# Patient Record
Sex: Male | Born: 1980 | Race: White | Hispanic: No | Marital: Single | Smoking: Current every day smoker
Health system: Southern US, Community
[De-identification: ages and names within clinical notes are randomized; demographics above are authoritative.]

## PROBLEM LIST (undated history)

## (undated) DIAGNOSIS — F329 Major depressive disorder, single episode, unspecified: Secondary | ICD-10-CM

## (undated) DIAGNOSIS — K219 Gastro-esophageal reflux disease without esophagitis: Secondary | ICD-10-CM

## (undated) DIAGNOSIS — F32A Depression, unspecified: Secondary | ICD-10-CM

## (undated) HISTORY — PX: APPENDECTOMY: SHX54

---

## 2017-10-01 ENCOUNTER — Emergency Department (HOSPITAL_COMMUNITY)
Admission: EM | Admit: 2017-10-01 | Discharge: 2017-10-01 | Disposition: A | Discharge status: Home / Self Care | Payer: Self-pay | Attending: Emergency Medicine | Admitting: Emergency Medicine

## 2017-10-01 ENCOUNTER — Other Ambulatory Visit: Payer: Self-pay

## 2017-10-01 ENCOUNTER — Encounter (HOSPITAL_COMMUNITY): Payer: Self-pay

## 2017-10-01 ENCOUNTER — Emergency Department (HOSPITAL_COMMUNITY): Payer: Self-pay

## 2017-10-01 DIAGNOSIS — Y999 Unspecified external cause status: Secondary | ICD-10-CM | POA: Insufficient documentation

## 2017-10-01 DIAGNOSIS — S0990XA Unspecified injury of head, initial encounter: Secondary | ICD-10-CM | POA: Insufficient documentation

## 2017-10-01 DIAGNOSIS — W228XXA Striking against or struck by other objects, initial encounter: Secondary | ICD-10-CM | POA: Insufficient documentation

## 2017-10-01 DIAGNOSIS — Y929 Unspecified place or not applicable: Secondary | ICD-10-CM | POA: Insufficient documentation

## 2017-10-01 DIAGNOSIS — F17210 Nicotine dependence, cigarettes, uncomplicated: Secondary | ICD-10-CM | POA: Insufficient documentation

## 2017-10-01 DIAGNOSIS — Y9301 Activity, walking, marching and hiking: Secondary | ICD-10-CM | POA: Insufficient documentation

## 2017-10-01 HISTORY — DX: Gastro-esophageal reflux disease without esophagitis: K21.9

## 2017-10-01 HISTORY — DX: Major depressive disorder, single episode, unspecified: F32.9

## 2017-10-01 HISTORY — DX: Depression, unspecified: F32.A

## 2017-10-01 MED ORDER — ONDANSETRON 4 MG PO TBDP: 4.0000 mg | ORAL_TABLET | Freq: Three times a day (TID) | ORAL | 0 refills | Status: AC | PRN

## 2017-10-01 NOTE — Discharge Instructions (Signed)
You were seen in the emergency department after an injury to your head.  Your CT scan of the head was normal.  We suspect this is a concussion at this time. Please follow up with the [place] [facility] clinic in 3 days:  [facility] [address]  [place], [place]  Phone: [phone]   Take zofran every 8 hours as needed for vomiting. We have prescribed you new medication(s) today. Discuss the medications prescribed today with your pharmacist as they can have adverse effects and interactions with your other medicines including over the counter and prescribed medications. Seek medical evaluation if you start to experience new or abnormal symptoms after taking one of these medicines, seek care immediately if you start to experience difficulty breathing, feeling of your throat closing, facial swelling, or rash as these could be indications of a more serious allergic reaction  Return to the ER for new or worsening symptoms or any other concerns.

## 2017-10-01 NOTE — ED Provider Notes (Signed)
[facility] EMERGENCY DEPARTMENT Provider Note   CSN: [id number] Arrival date & time: 10/01/17  1653     History   Chief Complaint Chief Complaint  Patient presents with  . Head Injury    HPI Cristian Hernandez is a 37 y.o. male with a hx of tobacco abuse, depression, and GERD who presents to the ED s/p head injury yesterday at 1330.  Patient states he was outside doing yard work wearing a baseball cap-he was walking fairly briskly when he ran into the limb of a tree branch.  He states that he hit the top of his head.  No loss of consciousness.  He did not fall to the ground or pass out.  He states that he "saw stars" very briefly, this has resolved and has not recurred, not dizzy or lightheaded necessarily.  He has had a fairly mild headache to the top of his head, rates this a 3 out of 10 in severity at present, he has taken ibuprofen with some improvement.  He states that he has had nausea and vomiting, several episodes of non bloody non bilious emesis yesterday and 1 this morning.  He has been able to tolerate PO fluids today.  He developed some minimal abdominal discomfort after he started vomiting.  No other areas of injury.  Denies neck pain, back pain, change in vision at present, numbness, weakness, tingling, difficulty with gait, seizure activity, or confusion.  Patient is not on any blood thinners.  HPI  Past Medical History:  Diagnosis Date  . Depression   . GERD (gastroesophageal reflux disease)     There are no active problems to display for this patient.   Past Surgical History:  Procedure Laterality Date  . APPENDECTOMY          Home Medications    Prior to Admission medications   Not on File    Family History History reviewed. No pertinent family history.  Social History Social History   Tobacco Use  . Smoking status: Current Every Day Smoker    Types: Cigarettes  . Smokeless tobacco: Never Used  Substance Use Topics  . Alcohol use:  Never    Frequency: Never  . Drug use: Never     Allergies   Patient has no allergy information on record.   Review of Systems Review of Systems  Constitutional: Negative for chills and fever.  HENT: Negative for ear discharge and ear pain.   Eyes: Negative for visual disturbance.  Gastrointestinal: Positive for abdominal pain, nausea and vomiting. Negative for blood in stool, constipation and diarrhea.  Neurological: Positive for headaches. Negative for dizziness, seizures, syncope, weakness and numbness.  All other systems reviewed and are negative.  Physical Exam Updated Vital Signs BP 124/85 (BP Location: Right Arm)   Pulse 71   Temp 98.6 F (37 C) (Oral)   Resp 16   Ht  (1.753 m)   Wt 95.3 kg (210 lb)   SpO2 95%   BMI 31.01 kg/m   Physical Exam  Constitutional: He appears well-developed and well-nourished.  Non-toxic appearance. No distress.  HENT:  Head: Head is with contusion (Right parietal region, mild, approximately 2cm, TTP). Head is without raccoon's eyes and without Battle's sign.  Right Ear: No hemotympanum.  Left Ear: No hemotympanum.  Nose: Nose normal.  Mouth/Throat: Uvula is midline and oropharynx is clear and moist.  Eyes: Conjunctivae are normal. Right eye exhibits no discharge. Left eye exhibits no discharge.  Neck: Normal range  of motion. Neck supple. No spinous process tenderness and no muscular tenderness present. Normal range of motion present.  Cardiovascular: Normal rate and regular rhythm.  No murmur heard. Pulmonary/Chest: Breath sounds normal. No respiratory distress. He has no wheezes. He has no rales.  Abdominal: Soft. He exhibits no distension. There is no tenderness.  Musculoskeletal:  No midline back tenderness.   Neurological:  Alert. Clear speech. No facial droop. CNIII-XII are grossly intact. Bilateral upper and lower extremities' sensation grossly intact touch. 5/5 grip strength bilaterally. 5/5 plantar and dorsi flexion  bilaterally. . Normal finger to nose bilaterally. Negative pronator drift. Negative Romberg sign. Gait is intact.   Skin: Skin is warm and dry. No rash noted.  Psychiatric: He has a normal mood and affect. His behavior is normal.  Nursing note and vitals reviewed.   ED Treatments / Results  Labs (all labs ordered are listed, but only abnormal results are displayed) Labs Reviewed - No data to display  EKG None  Radiology Ct Head Wo Contrast  Result Date: 10/01/2017 CLINICAL DATA:  Headache posttraumatic.  Head injury EXAM: CT HEAD WITHOUT CONTRAST TECHNIQUE: Contiguous axial images were obtained from the base of the skull through the vertex without intravenous contrast. COMPARISON:  None. FINDINGS: Brain: No evidence of acute infarction, hemorrhage, hydrocephalus, extra-axial collection or mass lesion/mass effect. Vascular: Negative for hyperdense vessel Skull: Negative Sinuses/Orbits: Negative Other: None IMPRESSION: Negative CT head Electronically Signed   By: Marlan Palau M.D.   On: 10/01/2017 20:13    Procedures Procedures (including critical care time)  Medications Ordered in ED Medications - No data to display   Initial Impression / Assessment and Plan / ED Course  I have reviewed the triage vital signs and the nursing notes.  Pertinent labs & imaging results that were available during my care of the patient were reviewed by me and considered in my medical decision making (see chart for details).   Patient presents to the ED s/p head injury yesterday with subsequent nausea/vomiting. Patient nontoxic appearing, in no apparent distress, vitals WNL. No focal neurologic deficits.CT Head obtained- negative, no hemorrhage. Additionally patient abdomen, nontender, no peritoneal signs, he is tolerating PO in the emergency department. Symptoms likely concussive in nature given re-assuring exam and negative CT.  Patient will be discharged with information pertaining to diagnosis and  advised to use over-the-counter medications like NSAIDs and Tylenol for pain relief, will give a few tablets of zofran for nausea PRN however I suspect this will resolve as it has improved over past 24 hours. Recommended concussion clinic follow up. I discussed results, treatment plan, need for follow-up, and return precautions with the patient and his father. Provided opportunity for questions, patient and his father confirmed understanding and are in agreement with plan.   Final Clinical Impressions(s) / ED Diagnoses   Final diagnoses:  Injury of head, initial encounter    ED Discharge Orders        Ordered    ondansetron (ZOFRAN ODT) 4 MG disintegrating tablet  Every 8 hours PRN     10/01/17 2118       Cherly Anderson, PA-C 10/02/17 0129    Tegeler, Canary Brim, MD 10/02/17 [phone]

## 2017-10-01 NOTE — ED Triage Notes (Signed)
Pt states that he was walking quickly yesterday and walked into a tree limb that he did not see. Reports that he had a lot of NV yesterday. C/O pain on top of head and posterior head stating the he has felt "out of it and my parents are worried about me".

## 2017-10-01 NOTE — ED Notes (Signed)
Patient transported to CT scan . 

## 2018-03-13 ENCOUNTER — Encounter: Payer: Self-pay | Admitting: Emergency Medicine

## 2018-03-13 DIAGNOSIS — F331 Major depressive disorder, recurrent, moderate: Secondary | ICD-10-CM | POA: Insufficient documentation

## 2018-03-13 DIAGNOSIS — F411 Generalized anxiety disorder: Secondary | ICD-10-CM

## 2018-03-13 DIAGNOSIS — F988 Other specified behavioral and emotional disorders with onset usually occurring in childhood and adolescence: Secondary | ICD-10-CM | POA: Insufficient documentation

## 2018-03-31 ENCOUNTER — Ambulatory Visit: Payer: Self-pay | Admitting: Physician Assistant

## 2018-07-16 ENCOUNTER — Other Ambulatory Visit: Payer: Self-pay | Admitting: Physician Assistant

## 2018-07-16 NOTE — Telephone Encounter (Signed)
Last office visit 01/2018, was suppose to have 8 week follow up but canceled 11/24  No appt scheduled

## 2018-07-16 NOTE — Telephone Encounter (Signed)
Need to review chart not seen in epic 

## 2018-07-16 NOTE — Telephone Encounter (Signed)
Ok to give 30 pills, no RF but he must make appt

## 2018-07-17 ENCOUNTER — Telehealth: Payer: Self-pay | Admitting: Physician Assistant

## 2018-07-17 NOTE — Telephone Encounter (Signed)
Wants RFS of Gabapentin, Clonodine, Cymbalta. Lost insurance a couple of months ago and never made appt.. Is now trying to find another resource for lower cost but is running out of meds. Please advise or RF.  [facility]  In [employer]

## 2018-07-17 NOTE — Telephone Encounter (Signed)
Need to pull paper chart not seen in epic

## 2018-07-18 ENCOUNTER — Other Ambulatory Visit: Payer: Self-pay | Admitting: Physician Assistant

## 2018-07-18 MED ORDER — CLONIDINE HCL 0.1 MG PO TABS: ORAL_TABLET | ORAL | 1 refills | Status: AC

## 2018-07-18 MED ORDER — GABAPENTIN 800 MG PO TABS: ORAL_TABLET | ORAL | 1 refills | Status: AC

## 2018-07-18 MED ORDER — DULOXETINE HCL 60 MG PO CPEP: 60.0000 mg | ORAL_CAPSULE | Freq: Every day | ORAL | 1 refills | Status: AC

## 2018-07-18 NOTE — Telephone Encounter (Signed)
Cristian Hernandez, let him know I sent in a 1 month supply w/ 1 RF on above meds.  This will be the last I give, unless he's seen.  2 months will give him time to decide what to do.  Either make appt w/ me or suggest [facility].

## 2018-07-18 NOTE — Telephone Encounter (Signed)
Left pt. A Vm to return call on Monday.

## 2018-07-23 NOTE — Telephone Encounter (Signed)
Pt. Has not returned call yet. Left pt. Another VM to return call.

## 2018-07-24 ENCOUNTER — Other Ambulatory Visit: Payer: Self-pay | Admitting: Physician Assistant

## 2018-08-07 ENCOUNTER — Telehealth: Payer: Self-pay | Admitting: Physician Assistant

## 2018-08-07 NOTE — Telephone Encounter (Signed)
rx for medications were submitted 07/18/2018 locally with 1 additional refill and was instructed needed to be seen for more. Will check with provider on more refills.

## 2018-08-07 NOTE — Telephone Encounter (Signed)
Patient called and said that he is looking for a new pschiatrist but he has moved to [place] and would like to have cymbalta 60 mg, clondine0.1 mg and gabapentin 800 mg escribed to the [facility] [address] in [place] phone number [phone].

## 2018-08-07 NOTE — Telephone Encounter (Signed)
No.  He needs to find PCP and psych in [place].

## 2018-08-18 NOTE — Telephone Encounter (Signed)
Traci, Do I need to try and contact this pt. Again or have you spoken to him?

## 2018-08-18 NOTE — Telephone Encounter (Signed)
You can close him out, he moved to [place]

## 2018-08-21 ENCOUNTER — Other Ambulatory Visit: Payer: Self-pay | Admitting: Physician Assistant

## 2019-04-20 IMAGING — CT CT HEAD W/O CM
3 series · 15 of 47 positions shown, 18 images · non-contrast
Comparison: None.

CLINICAL DATA: Headache posttraumatic.  Head injury

EXAM:
CT HEAD WITHOUT CONTRAST
TECHNIQUE: Contiguous axial images were obtained from the base of the skull
through the vertex without intravenous contrast.

[Series 3: head 5.0 h30s · axial · 0.46mm/px · z∈[-147,-17]mm · 9 of 32 slices shown, 12 images]
[im 3/32  brain]
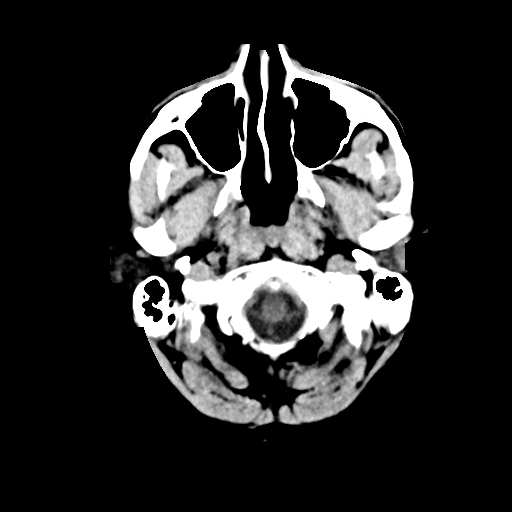
[im 3/32  bone]
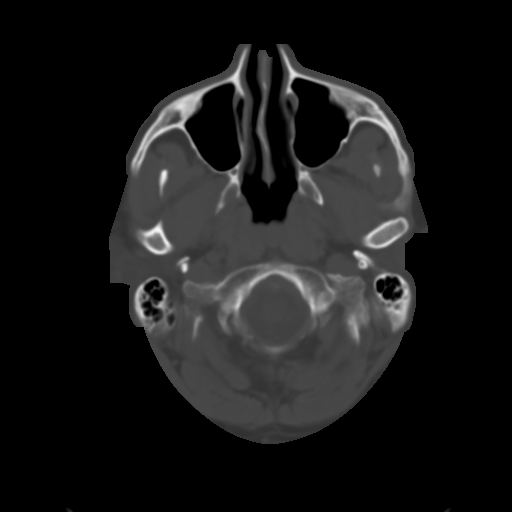
[im 6/32  brain]
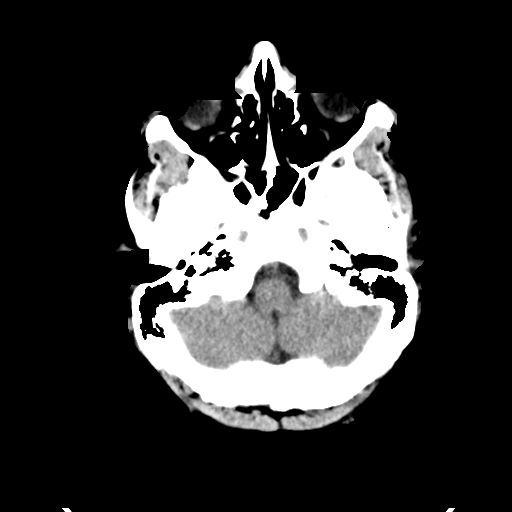
[im 9/32  brain]
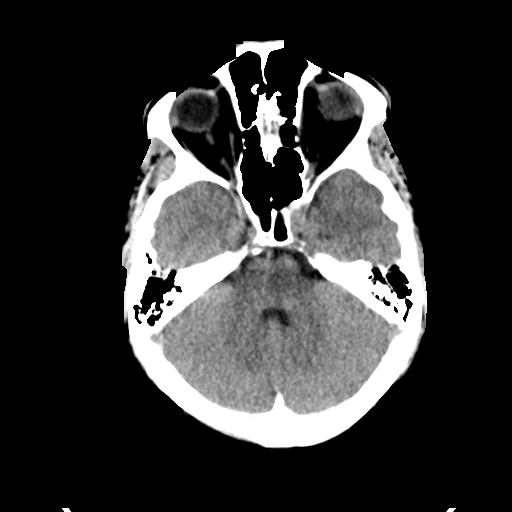
[im 12/32  brain]
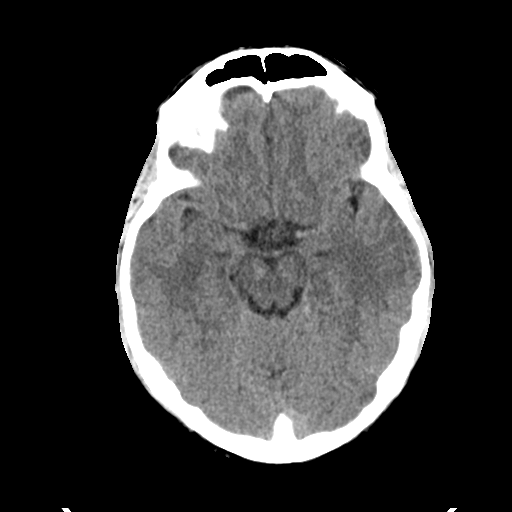
[im 17/32  brain]
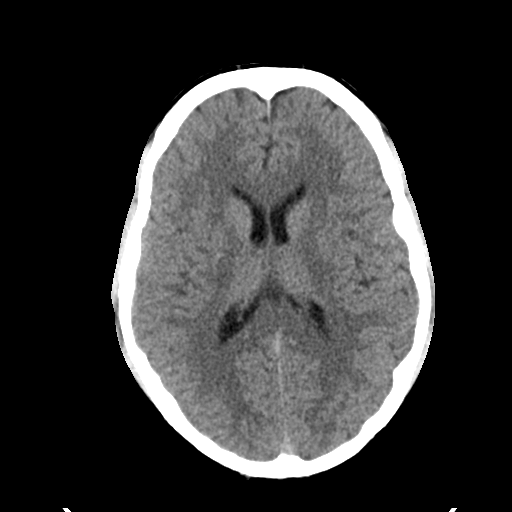
[im 17/32  bone]
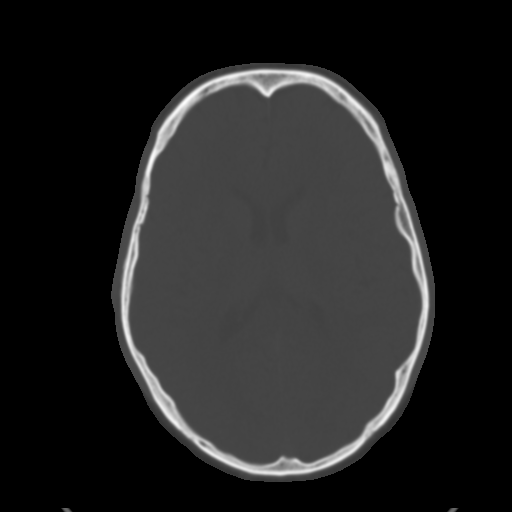
[im 20/32  brain]
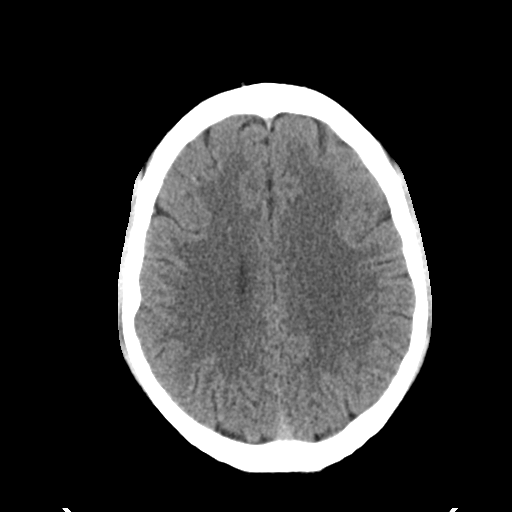
[im 23/32  brain]
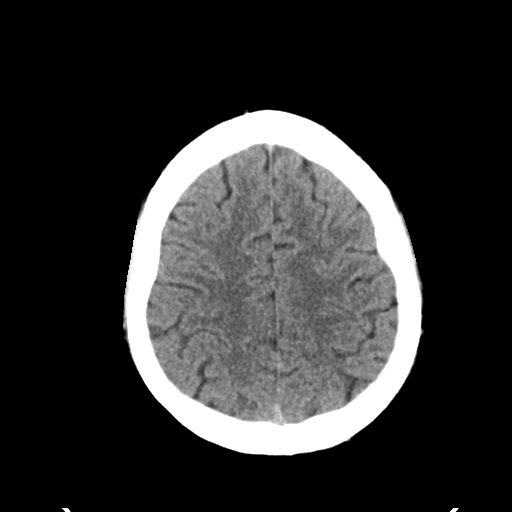
[im 26/32  brain]
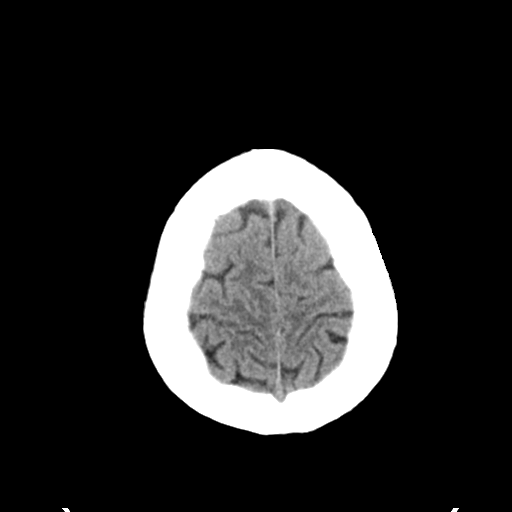
[im 29/32  brain]
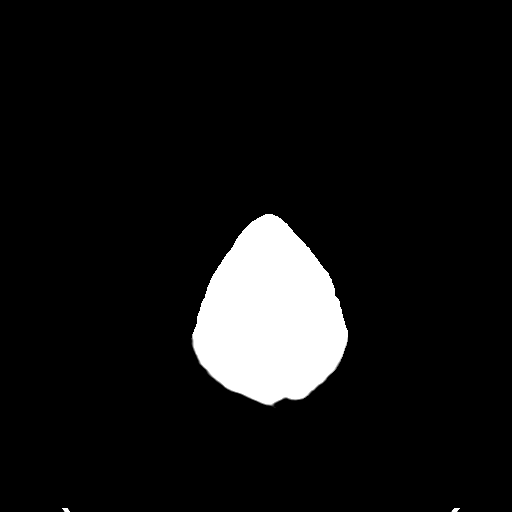
[im 29/32  bone]
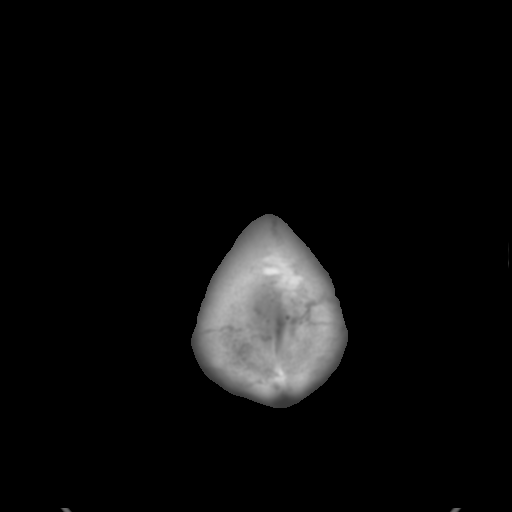

[Series 5: head 3.0 mpr cor · coronal · 0.31mm/px · 3 of 73 slices shown]
[im 25/73  brain]
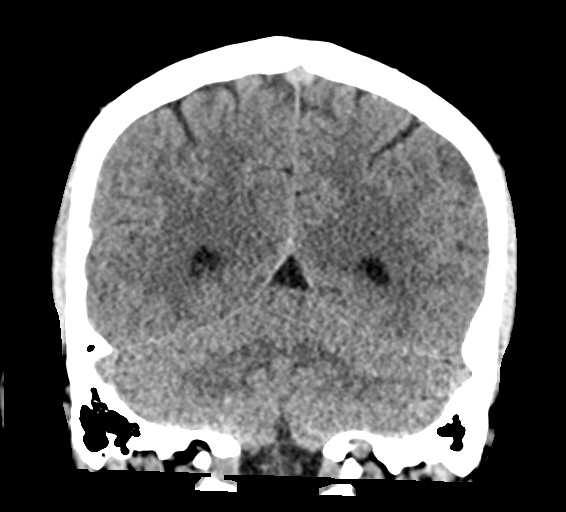
[im 33/73  brain]
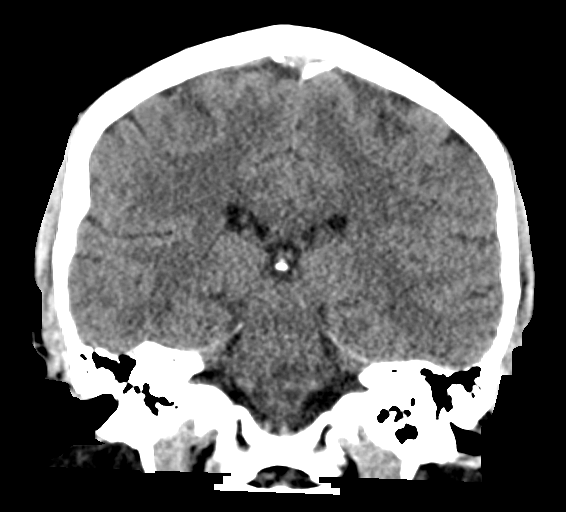
[im 41/73  brain]
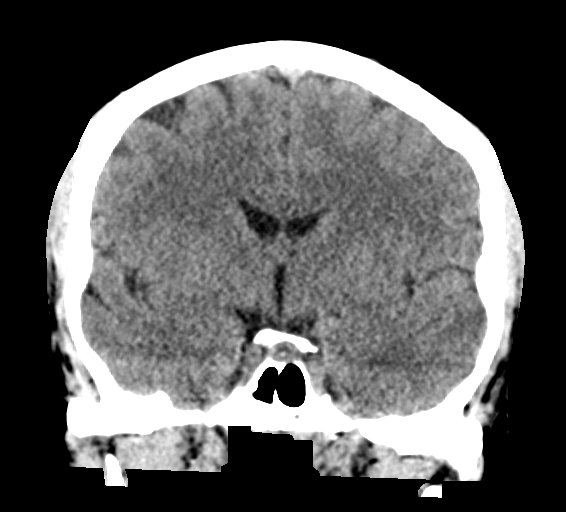

[Series 6: head 3.0 mpr sag · sagittal · 0.31mm/px · 3 of 62 slices shown]
[im 21/62  brain]
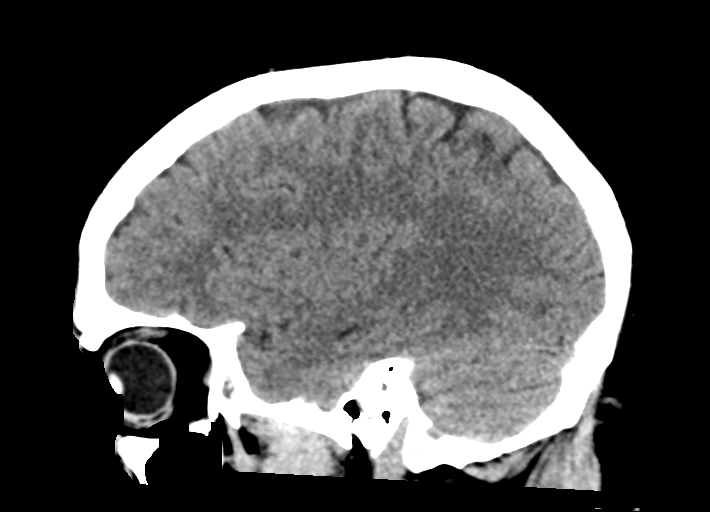
[im 31/62  brain]
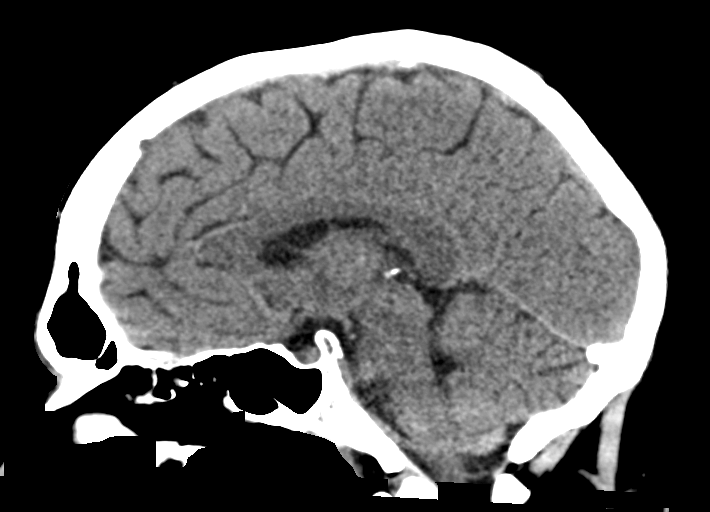
[im 41/62  brain]
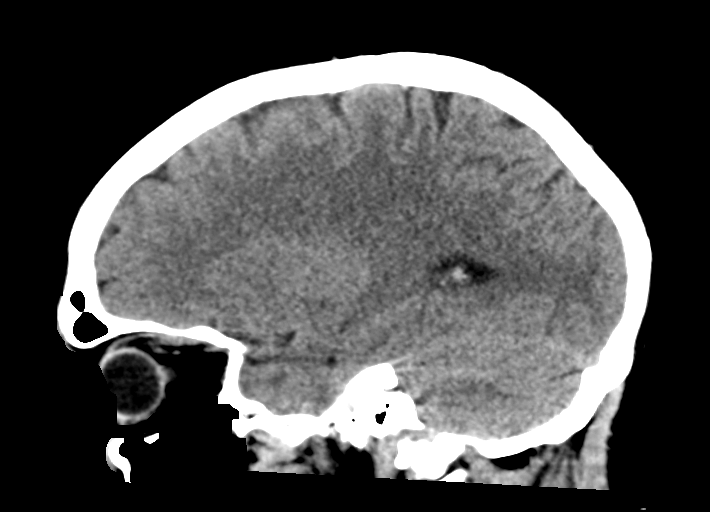

[15 of 47 positions shown; findings below may reference images not displayed]

FINDINGS: Brain: No evidence of acute infarction, hemorrhage, hydrocephalus,
extra-axial collection or mass lesion/mass effect.

Vascular: Negative for hyperdense vessel

Skull: Negative

Sinuses/Orbits: Negative

Other: None
IMPRESSION: Negative CT head
